# Patient Record
Sex: Female | Born: 1995 | Race: White | Hispanic: No | Smoking: Never smoker
Health system: Southern US, Community
[De-identification: ages and names within clinical notes are randomized; demographics above are authoritative.]

## PROBLEM LIST (undated history)

## (undated) HISTORY — PX: TONSILLECTOMY: SUR1361

## (undated) HISTORY — PX: WISDOM TOOTH EXTRACTION: SHX21

---

## 2015-07-18 ENCOUNTER — Encounter (HOSPITAL_COMMUNITY): Payer: Self-pay

## 2015-07-18 DIAGNOSIS — Z880 Allergy status to penicillin: Secondary | ICD-10-CM | POA: Insufficient documentation

## 2015-07-18 DIAGNOSIS — Z792 Long term (current) use of antibiotics: Secondary | ICD-10-CM | POA: Insufficient documentation

## 2015-07-18 DIAGNOSIS — J95830 Postprocedural hemorrhage and hematoma of a respiratory system organ or structure following a respiratory system procedure: Secondary | ICD-10-CM | POA: Diagnosis present

## 2015-07-18 MED ORDER — OXYCODONE-ACETAMINOPHEN 5-325 MG PO TABS
1.0000 | ORAL_TABLET | Freq: Once | ORAL | Status: AC
  Administered 2015-07-18: 1 via ORAL

## 2015-07-18 MED ORDER — OXYCODONE-ACETAMINOPHEN 5-325 MG PO TABS
ORAL_TABLET | ORAL | Status: AC
  Filled 2015-07-18: qty 1

## 2015-07-18 NOTE — ED Notes (Addendum)
Pt had a tonsillectomy on 07/13/15 and started spitting out blood earlier today. Has pain in her throat. Still has vicodin at home and took liquid hydrocodone at 1900. Hurts to talk. Stopped taking the clindamycin yesterday because the doctor told her to. They thought it was making her throat swell.

## 2015-07-19 ENCOUNTER — Emergency Department (HOSPITAL_COMMUNITY)
Admission: EM | Admit: 2015-07-19 | Discharge: 2015-07-19 | Disposition: A | Discharge status: Home / Self Care | Payer: BLUE CROSS/BLUE SHIELD | Attending: Emergency Medicine | Admitting: Emergency Medicine

## 2015-07-19 DIAGNOSIS — J358 Other chronic diseases of tonsils and adenoids: Secondary | ICD-10-CM

## 2015-07-19 MED ORDER — AMINOCAPROIC ACID SOLUTION 5% (50 MG/ML)
10.0000 mL | ORAL | Status: DC
  Administered 2015-07-19: 10 mL via ORAL
  Filled 2015-07-19: qty 100

## 2015-07-19 NOTE — Discharge Instructions (Signed)
You were seen today for a post tonsillectomy bleed. Your bleeding resolved. You need to gargle and spit hydrogen peroxide twice daily. Follow-up with ENT tomorrow. If you have recurrent or worsening bleeding, you should be reevaluated.  Tonsillectomy, Adult A tonsillectomy is a surgery to remove your tonsils. Tonsils are lymph tissues at the back and upper part of your throat. Because tonsils can collect debris, they can become infected. Tonsillectomy often is done when nonsurgical treatments have been unsuccessful in resolving problems with tonsils.  LET [facility] CARE PROVIDER KNOW ABOUT:  Any allergies you have.  All medicines you are taking, including vitamins, herbs, eye drops, creams, and over-the-counter medicines, especially those containing aspirin or ibuprofen.  Previous problems you or members of your family have had with the use of anesthetics.  Any blood disorders you have.  Previous surgeries you have had.  Medical conditions you have.  Recent cough or fever you have had. RISKS AND COMPLICATIONS Generally, tonsillectomy is a safe procedure. However, as with any procedure, problems can occur. Possible problems include:  Bleeding.  Infection.  Scarring.  Changes in your sense of taste.  Changes in your voice.  Changes in swallowing. BEFORE THE PROCEDURE  Do not take any aspirin, ibuprofen, or any medicines that may contain these agents for 2 weeks before the procedure.  Do not eat or drink after midnight the night before the procedure. PROCEDURE   You will be given a medicine that makes you go to sleep (general anesthetic).  A device will be placed inside of your mouth that presses your tongue down so that the tonsils at the back of your throat can be removed without cuts on the outside of your neck or throat.  Your tonsils will typically be removed with a device called an electrocautery, which will cut your tonsils out and then shrink the surrounding blood  vessels at the same time so that you will not bleed after the procedure.  Usually, stitches will not be used to close the cut. A white scab (eschar) will form in the area where your tonsils used to be. AFTER THE PROCEDURE After surgery, you will be taken to a recovery area for close monitoring. Once you are awake, stable, and taking fluids well, you will be allowed to go home.    This information is not intended to replace advice given to you by your health care provider. Make sure you discuss any questions you have with your health care provider.   Document Released: 09/15/2000 Document Revised: 10/18/2014 Document Reviewed: 12/29/2012 Elsevier Interactive Patient Education [employer]

## 2015-07-19 NOTE — ED Notes (Signed)
Horton, MD at bedside.  

## 2015-07-19 NOTE — ED Provider Notes (Signed)
CSN: [id number]     Arrival date & time 07/18/15  2340 History  By signing my name below, I, Bethel Born, attest that this documentation has been prepared under the direction and in the presence of Shon Baton, MD. Electronically Signed: Bethel Born, ED Scribe. 07/19/2015. 3:09 AM  Chief Complaint  Patient presents with  . Post-op Problem    The history is provided by the patient and a parent. No language interpreter was used.   Tara Crosby is a 20 y.o. female 6 days s/p [facility]illect[facility] who presents to the Emergency Department complaining of spitting up blood with onset last night near 7 PM. Pt reports intermittent bleeding with clots noted, last episode near 3 hours ago. She attempted gargling, as instructed by her surgeon, with no improvement in bleeding.  Associated symptoms include throat pain that is worse with talking since the surgery. She has Vicodin and liquid hydrocodone at home, last dose near 7 PM. She stopped clindamycin at her doctor's recommendation yesterday. She did not call her surgeon's office for the bleeding.   History reviewed. No pertinent past medical history. Past Surgical History  Procedure Laterality Date  . Tonsillectomy    . Wisdom tooth extraction     No family history on file. Social History  Substance Use Topics  . Smoking status: Never Smoker   . Smokeless tobacco: None  . Alcohol Use: No   OB History    No data available     Review of Systems  HENT: Positive for sore throat.        Bleeding at surgical site  All other systems reviewed and are negative.  Allergies  Penicillins  Home Medications   Prior to Admission medications   Medication Sig Start Date End Date Taking? Authorizing Provider  AVIANE 0.1-20 MG-MCG tablet Take 1 tablet by mouth daily. 07/07/15  Yes Historical Provider, MD  clindamycin (CLEOCIN) 300 MG capsule Take 300 mg by mouth 3 (three) times daily. 07/13/15  Yes Historical  Provider, MD  HYDROcodone-acetaminophen (HYCET) 7.5-325 mg/15 ml solution Take 10-15 mLs by mouth every 4 (four) hours as needed for moderate pain.  07/13/15  Yes Historical Provider, MD   BP 138/95 mmHg  Pulse 95  Temp(Src) 99.1 F (37.3 C) (Oral)  Resp 18  SpO2 99%  LMP 07/03/2015 Physical Exam  Constitutional: She is oriented to person, place, and time. She appears well-developed and well-nourished.  Not talking but gesturing and appears in no acute distress  HENT:  Head: Normocephalic and atraumatic.  Mucous membranes moist and clear, no active bleeding noted, no clot or a sharp noted, tonsillectomy sites appear dry  Cardiovascular: Normal rate, regular rhythm and normal heart sounds.   No murmur heard. Pulmonary/Chest: Breath sounds normal. She has no wheezes.  Neurological: She is alert and oriented to person, place, and time.  Skin: Skin is warm and dry.  Psychiatric: She has a normal mood and affect.  Nursing note and vitals reviewed.   ED Course  Procedures (including critical care time) DIAGNOSTIC STUDIES: Oxygen Saturation is 99% on RA,  normal by my interpretation.    COORDINATION OF CARE: 2:58 AM Discussed treatment plan which includes Percocet/Roxicet and ENT consult with pt at bedside and pt agreed to plan.  3:07 AM-Consult complete with Dr.Gore (ENT). Patient case explained and discussed. Call ended at 3:08 AM   Labs Review Labs Reviewed - No data to display  Imaging Review No [id number].  EKG Interpretation None      MDM   Final diagnoses:  Tonsillar bleed    Patient presents with post tonsillectomy bleeding. Nontoxic on exam. No active bleeding. No clots or eschar noted. Last bleed was approximately 3 hours ago. She reports continued pain and decreased phonation but this is not new. Discussed with Dr. Emeline Darling, on call. He recommends hydrogen peroxide swishes twice daily and close follow-up with ENT. This was discussed with the patient and her  family. Will discharge home. Patient was given strict return precautions.  After history, exam, and medical workup I feel the patient has been appropriately medically screened and is safe for discharge home. Pertinent diagnoses were discussed with the patient. Patient was given return precautions.  I personally performed the services described in this documentation, which was scribed in my presence. The recorded information has been reviewed and is accurate.    Shon Baton, MD 07/19/15 [id number]

## 2015-09-07 ENCOUNTER — Other Ambulatory Visit: Payer: Self-pay | Admitting: Gastroenterology

## 2015-09-07 DIAGNOSIS — R109 Unspecified abdominal pain: Secondary | ICD-10-CM

## 2015-09-07 DIAGNOSIS — R1084 Generalized abdominal pain: Secondary | ICD-10-CM

## 2015-09-07 DIAGNOSIS — K5909 Other constipation: Secondary | ICD-10-CM

## 2015-09-07 DIAGNOSIS — K5900 Constipation, unspecified: Secondary | ICD-10-CM

## 2015-09-11 ENCOUNTER — Ambulatory Visit
Admission: RE | Admit: 2015-09-11 | Discharge: 2015-09-11 | Disposition: A | Discharge status: Home / Self Care | Payer: BLUE CROSS/BLUE SHIELD | Source: Ambulatory Visit | Attending: Gastroenterology | Admitting: Gastroenterology

## 2015-09-11 DIAGNOSIS — R1084 Generalized abdominal pain: Secondary | ICD-10-CM

## 2015-09-11 DIAGNOSIS — K5909 Other constipation: Secondary | ICD-10-CM

## 2015-09-22 ENCOUNTER — Other Ambulatory Visit: Payer: Self-pay | Admitting: Obstetrics and Gynecology

## 2015-09-25 LAB — CYTOLOGY - PAP

## 2016-05-06 ENCOUNTER — Other Ambulatory Visit: Payer: Self-pay | Admitting: Physician Assistant

## 2016-05-06 DIAGNOSIS — R197 Diarrhea, unspecified: Secondary | ICD-10-CM

## 2016-05-06 DIAGNOSIS — R1010 Upper abdominal pain, unspecified: Secondary | ICD-10-CM

## 2016-05-06 DIAGNOSIS — R112 Nausea with vomiting, unspecified: Secondary | ICD-10-CM

## 2016-05-16 ENCOUNTER — Other Ambulatory Visit: Payer: BLUE CROSS/BLUE SHIELD

## 2016-05-23 ENCOUNTER — Ambulatory Visit
Admission: RE | Admit: 2016-05-23 | Discharge: 2016-05-23 | Disposition: A | Discharge status: Home / Self Care | Payer: BLUE CROSS/BLUE SHIELD | Source: Ambulatory Visit | Attending: Physician Assistant | Admitting: Physician Assistant

## 2016-05-23 DIAGNOSIS — R197 Diarrhea, unspecified: Secondary | ICD-10-CM

## 2016-05-23 DIAGNOSIS — R1010 Upper abdominal pain, unspecified: Secondary | ICD-10-CM

## 2016-05-23 DIAGNOSIS — R112 Nausea with vomiting, unspecified: Secondary | ICD-10-CM

## 2017-08-09 IMAGING — US US ABDOMEN COMPLETE
1 series · 14 of 25 positions shown · non-contrast
Comparison: None in PACs

CLINICAL DATA: Upper abdominal pain with nausea and vomiting for
the past month.

EXAM:
ABDOMEN ULTRASOUND COMPLETE

[Series 1: us abdomen complete · 0.20mm/px · 14 of 81 slices shown]
[im 1/81]
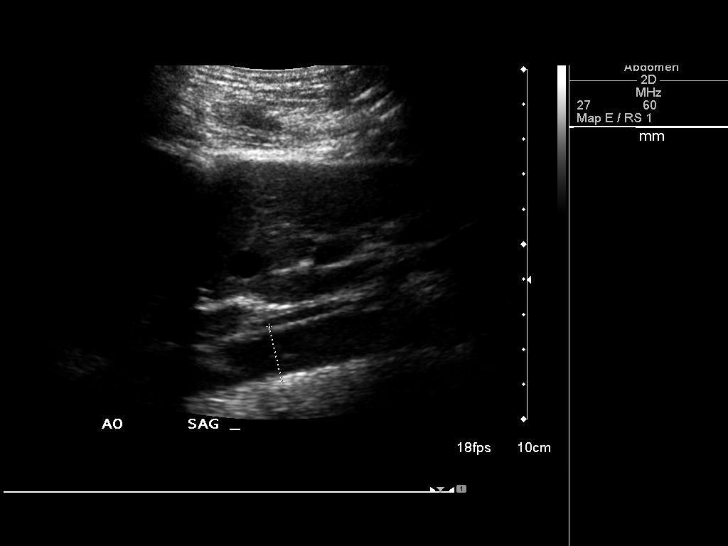
[im 7/81]
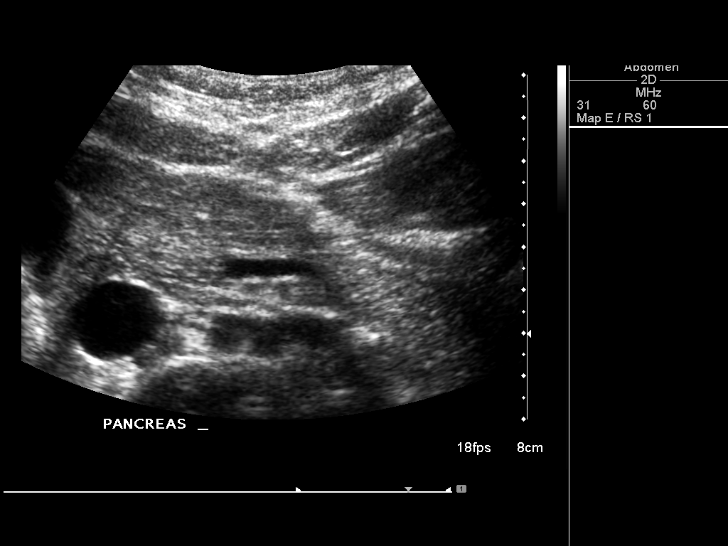
[im 14/81]
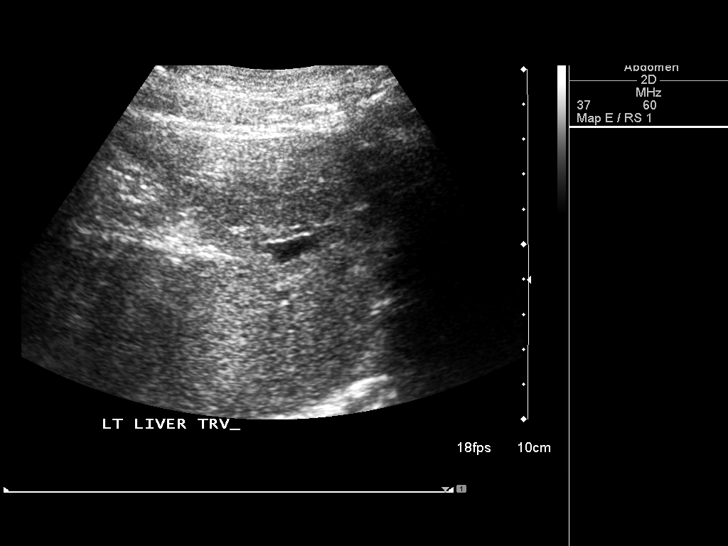
[im 21/81]
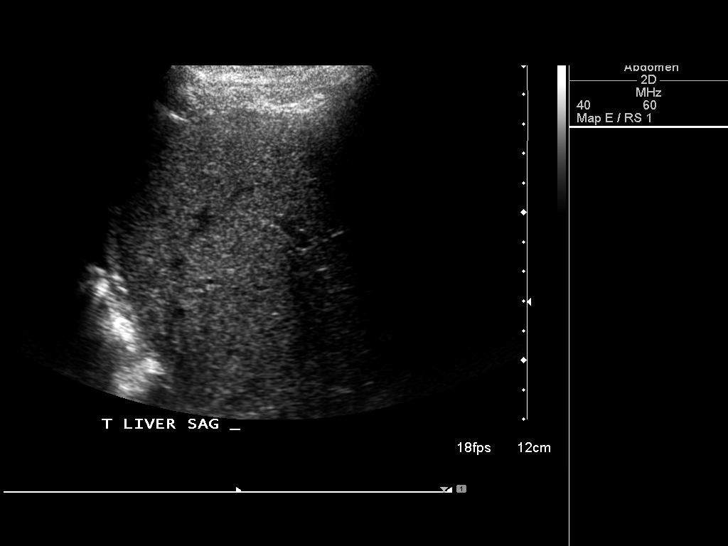
[im 27/81]
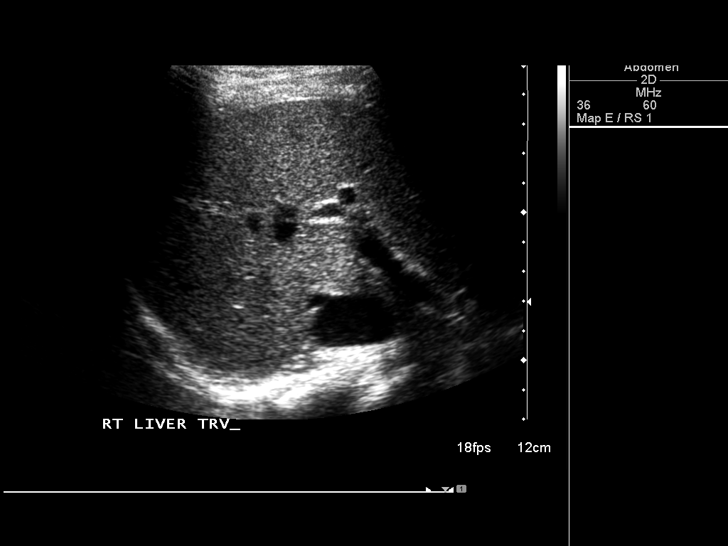
[im 31/81]
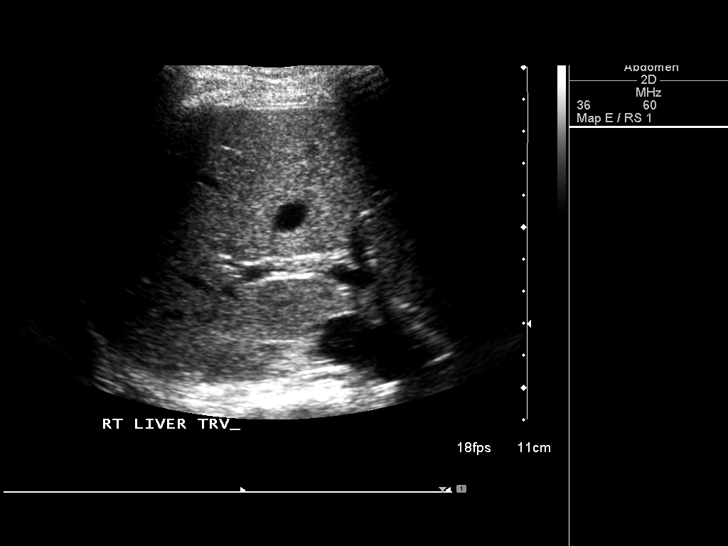
[im 37/81]
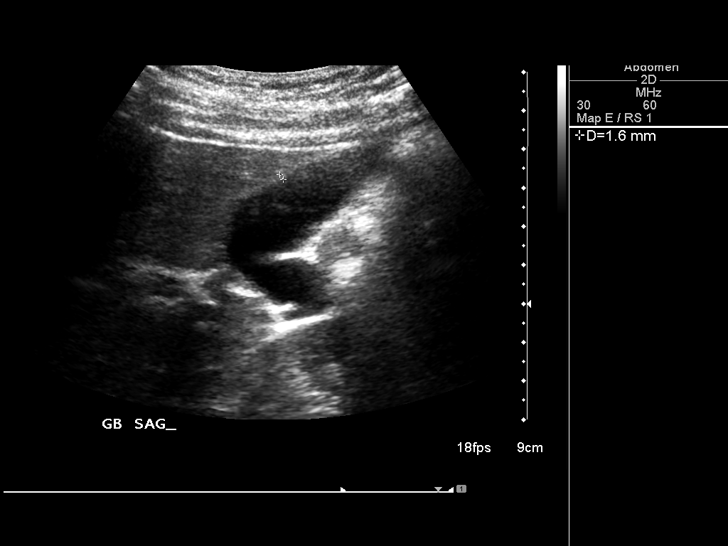
[im 44/81]
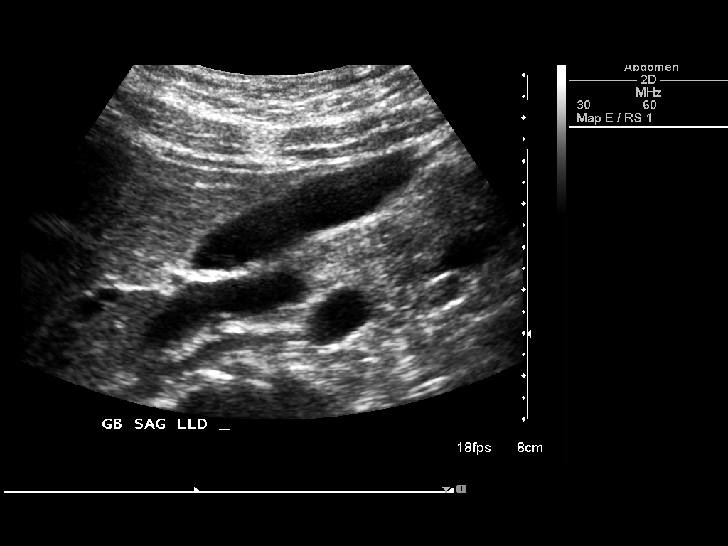
[im 51/81]
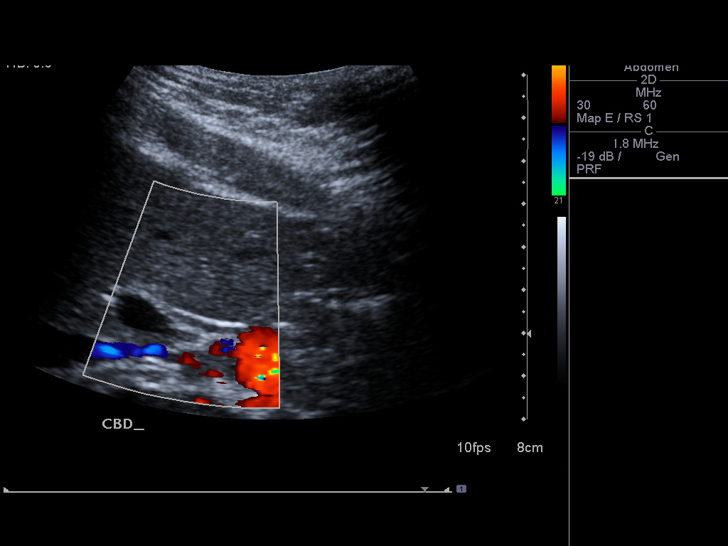
[im 54/81]
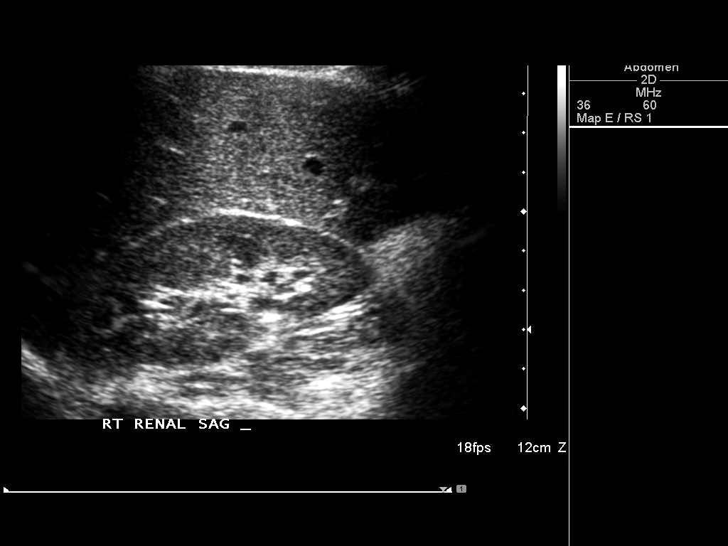
[im 61/81]
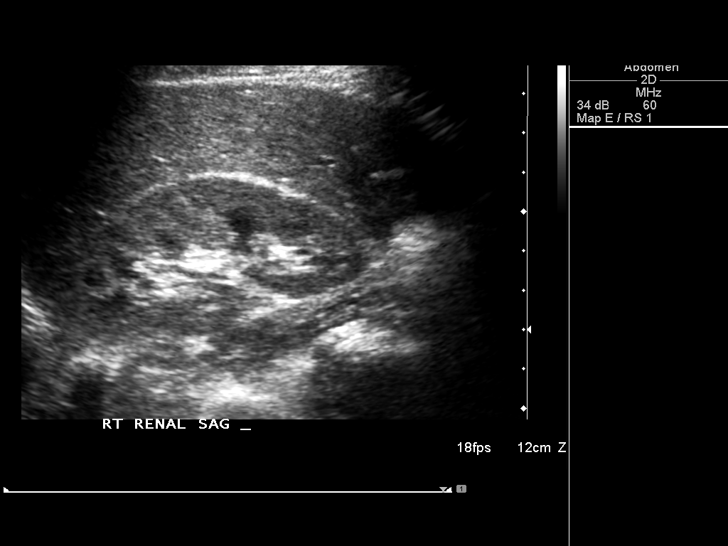
[im 67/81]
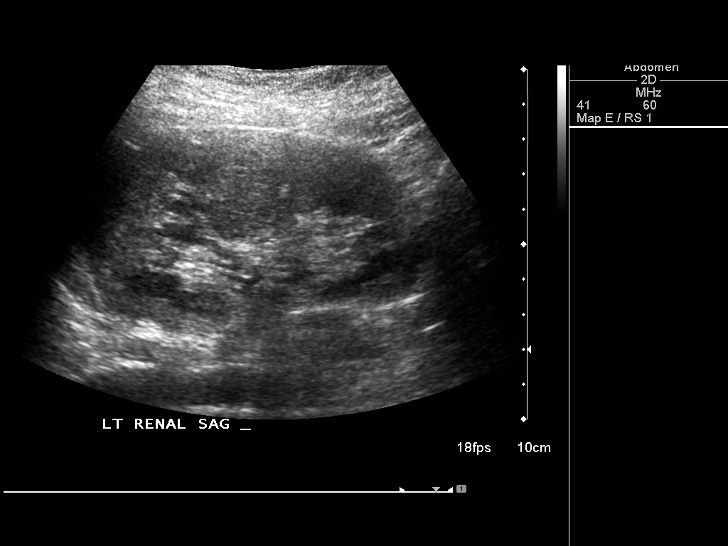
[im 74/81]
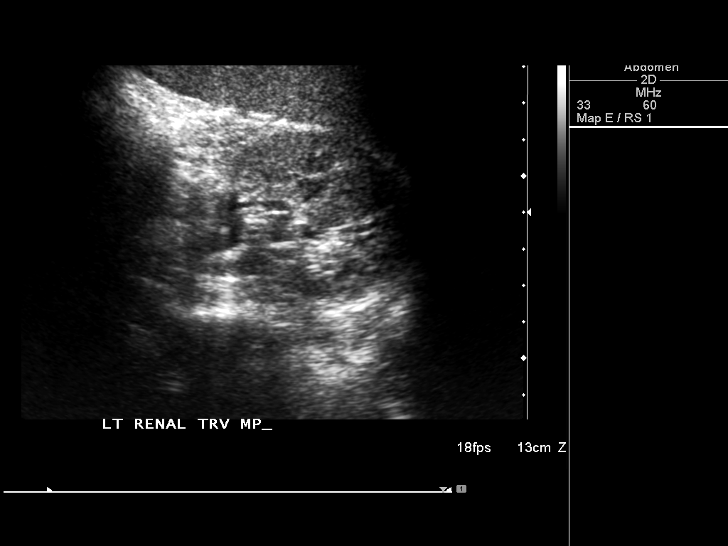
[im 81/81]
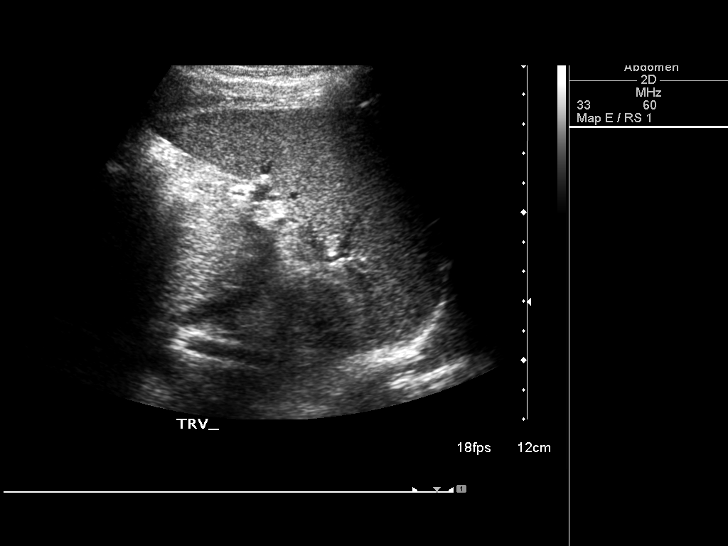

[14 of 25 positions shown; findings below may reference images not displayed]

FINDINGS: Gallbladder: No gallstones or wall thickening visualized. No
sonographic Murphy sign noted by sonographer.

Common bile duct: Diameter: 2.5 mm

Liver: No focal lesion identified. Within normal limits in
parenchymal echogenicity.

IVC: No abnormality visualized.

Pancreas: Visualized portion unremarkable.

Spleen: Size and appearance within normal limits.

Right Kidney: Length: 9.3 cm. Echogenicity within normal limits. No
mass or hydronephrosis visualized.

Left Kidney: Length: 9.9 cm. Echogenicity within normal limits. No
mass or hydronephrosis visualized.

Abdominal aorta: No aneurysm visualized.

Other findings: There is no ascites.
IMPRESSION: Normal abdominal ultrasound examination. If there are clinical
concerns of gallbladder dysfunction, a nuclear medicine
hepatobiliary scan with gallbladder ejection fraction determination
would be useful.
